# Patient Record
Sex: Male | Born: 1975 | Race: White | Hispanic: No | Marital: Married
Health system: Southern US, Community
[De-identification: ages and names within clinical notes are randomized; demographics above are authoritative.]

## PROBLEM LIST (undated history)

## (undated) DIAGNOSIS — I82409 Acute embolism and thrombosis of unspecified deep veins of unspecified lower extremity: Secondary | ICD-10-CM

## (undated) DIAGNOSIS — E119 Type 2 diabetes mellitus without complications: Secondary | ICD-10-CM

## (undated) DIAGNOSIS — I10 Essential (primary) hypertension: Secondary | ICD-10-CM

## (undated) HISTORY — DX: Essential (primary) hypertension: I10

## (undated) HISTORY — DX: Type 2 diabetes mellitus without complications: E11.9

---

## 2009-07-05 ENCOUNTER — Emergency Department (HOSPITAL_COMMUNITY): Admission: EM | Admit: 2009-07-05 | Discharge: 2009-07-06 | Payer: Self-pay | Admitting: Emergency Medicine

## 2022-02-20 ENCOUNTER — Emergency Department (HOSPITAL_COMMUNITY): Payer: 59

## 2022-02-20 ENCOUNTER — Encounter (HOSPITAL_COMMUNITY): Payer: Self-pay

## 2022-02-20 ENCOUNTER — Emergency Department (HOSPITAL_COMMUNITY)
Admission: EM | Admit: 2022-02-20 | Discharge: 2022-02-20 | Disposition: A | Discharge status: Home / Self Care | Payer: 59 | Attending: Emergency Medicine | Admitting: Emergency Medicine

## 2022-02-20 ENCOUNTER — Other Ambulatory Visit: Payer: Self-pay

## 2022-02-20 DIAGNOSIS — S161XXA Strain of muscle, fascia and tendon at neck level, initial encounter: Secondary | ICD-10-CM | POA: Diagnosis not present

## 2022-02-20 DIAGNOSIS — Y92410 Unspecified street and highway as the place of occurrence of the external cause: Secondary | ICD-10-CM | POA: Diagnosis not present

## 2022-02-20 DIAGNOSIS — S01111A Laceration without foreign body of right eyelid and periocular area, initial encounter: Secondary | ICD-10-CM | POA: Diagnosis not present

## 2022-02-20 DIAGNOSIS — S199XXA Unspecified injury of neck, initial encounter: Secondary | ICD-10-CM | POA: Diagnosis not present

## 2022-02-20 DIAGNOSIS — M25512 Pain in left shoulder: Secondary | ICD-10-CM | POA: Insufficient documentation

## 2022-02-20 DIAGNOSIS — S0083XA Contusion of other part of head, initial encounter: Secondary | ICD-10-CM

## 2022-02-20 MED ORDER — HYDROCODONE-ACETAMINOPHEN 5-325 MG PO TABS: 1.0000 | ORAL_TABLET | Freq: Four times a day (QID) | ORAL | 0 refills | Status: DC | PRN

## 2022-02-20 NOTE — Discharge Instructions (Signed)
Take over-the-counter pain medication as needed.  Expect to be sore and stiff for the next couple days.  Prescription for hydrocodone provided for more severe pain.  Follow-up with your doctor.  Is possible you could have aggravated a pinched nerve in your neck on the left side. ?
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
## 2022-02-20 NOTE — ED Notes (Signed)
Verbalized understanding discharge instructions and follow-up. In no acute distress.  ? ?Electronic prescribing not currently working.  Pt provided w/ 6 tablet Pre Pak.  RN and Pt signed prescription and placed in pharmacy box.  ? ?
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
## 2022-02-25 ENCOUNTER — Encounter (HOSPITAL_COMMUNITY): Payer: Self-pay

## 2022-03-11 DIAGNOSIS — M542 Cervicalgia: Secondary | ICD-10-CM | POA: Diagnosis not present

## 2022-03-11 DIAGNOSIS — Z6838 Body mass index (BMI) 38.0-38.9, adult: Secondary | ICD-10-CM | POA: Diagnosis not present

## 2022-03-11 DIAGNOSIS — S43422A Sprain of left rotator cuff capsule, initial encounter: Secondary | ICD-10-CM | POA: Diagnosis not present

## 2022-04-01 ENCOUNTER — Ambulatory Visit (HOSPITAL_COMMUNITY): Payer: 59 | Attending: Internal Medicine
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
## 2022-04-25 ENCOUNTER — Ambulatory Visit (HOSPITAL_COMMUNITY): Payer: 59 | Attending: Internal Medicine

## 2022-04-25 DIAGNOSIS — M25512 Pain in left shoulder: Secondary | ICD-10-CM | POA: Insufficient documentation

## 2022-04-25 DIAGNOSIS — G8929 Other chronic pain: Secondary | ICD-10-CM | POA: Insufficient documentation

## 2022-04-25 DIAGNOSIS — R278 Other lack of coordination: Secondary | ICD-10-CM | POA: Diagnosis not present

## 2022-04-25 DIAGNOSIS — R29898 Other symptoms and signs involving the musculoskeletal system: Secondary | ICD-10-CM | POA: Diagnosis not present

## 2022-04-25 DIAGNOSIS — M25612 Stiffness of left shoulder, not elsewhere classified: Secondary | ICD-10-CM | POA: Insufficient documentation

## 2022-04-25 DIAGNOSIS — M542 Cervicalgia: Secondary | ICD-10-CM | POA: Insufficient documentation
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
## 2022-04-30 ENCOUNTER — Ambulatory Visit (HOSPITAL_COMMUNITY): Payer: 59

## 2022-04-30 ENCOUNTER — Encounter (HOSPITAL_COMMUNITY): Payer: Self-pay

## 2022-04-30 DIAGNOSIS — G8929 Other chronic pain: Secondary | ICD-10-CM

## 2022-04-30 DIAGNOSIS — R278 Other lack of coordination: Secondary | ICD-10-CM | POA: Diagnosis not present

## 2022-04-30 DIAGNOSIS — M25512 Pain in left shoulder: Secondary | ICD-10-CM | POA: Diagnosis not present

## 2022-04-30 DIAGNOSIS — R29898 Other symptoms and signs involving the musculoskeletal system: Secondary | ICD-10-CM

## 2022-04-30 DIAGNOSIS — M25612 Stiffness of left shoulder, not elsewhere classified: Secondary | ICD-10-CM | POA: Diagnosis not present

## 2022-04-30 DIAGNOSIS — M542 Cervicalgia: Secondary | ICD-10-CM | POA: Diagnosis not present
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
## 2022-05-02 ENCOUNTER — Ambulatory Visit (HOSPITAL_COMMUNITY): Payer: 59

## 2022-05-02 DIAGNOSIS — R29898 Other symptoms and signs involving the musculoskeletal system: Secondary | ICD-10-CM

## 2022-05-02 DIAGNOSIS — R278 Other lack of coordination: Secondary | ICD-10-CM

## 2022-05-02 DIAGNOSIS — G8929 Other chronic pain: Secondary | ICD-10-CM | POA: Diagnosis not present

## 2022-05-02 DIAGNOSIS — M542 Cervicalgia: Secondary | ICD-10-CM | POA: Diagnosis not present

## 2022-05-02 DIAGNOSIS — M25612 Stiffness of left shoulder, not elsewhere classified: Secondary | ICD-10-CM | POA: Diagnosis not present

## 2022-05-02 DIAGNOSIS — M25512 Pain in left shoulder: Secondary | ICD-10-CM | POA: Diagnosis not present
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
## 2022-05-06 ENCOUNTER — Encounter (HOSPITAL_COMMUNITY): Payer: Self-pay

## 2022-05-06 ENCOUNTER — Ambulatory Visit (HOSPITAL_COMMUNITY): Payer: 59

## 2022-05-06 DIAGNOSIS — R278 Other lack of coordination: Secondary | ICD-10-CM

## 2022-05-06 DIAGNOSIS — G8929 Other chronic pain: Secondary | ICD-10-CM | POA: Diagnosis not present

## 2022-05-06 DIAGNOSIS — R29898 Other symptoms and signs involving the musculoskeletal system: Secondary | ICD-10-CM

## 2022-05-06 DIAGNOSIS — M25612 Stiffness of left shoulder, not elsewhere classified: Secondary | ICD-10-CM | POA: Diagnosis not present

## 2022-05-06 DIAGNOSIS — M25512 Pain in left shoulder: Secondary | ICD-10-CM | POA: Diagnosis not present

## 2022-05-06 DIAGNOSIS — M542 Cervicalgia: Secondary | ICD-10-CM | POA: Diagnosis not present
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
## 2022-05-08 ENCOUNTER — Ambulatory Visit (HOSPITAL_COMMUNITY): Payer: 59 | Admitting: Physical Therapy

## 2022-05-08 ENCOUNTER — Ambulatory Visit (HOSPITAL_COMMUNITY): Payer: 59

## 2022-05-14 ENCOUNTER — Ambulatory Visit (HOSPITAL_COMMUNITY): Payer: 59 | Admitting: Physical Therapy

## 2022-05-14 ENCOUNTER — Encounter (HOSPITAL_COMMUNITY): Payer: Self-pay | Admitting: Physical Therapy

## 2022-05-14 ENCOUNTER — Ambulatory Visit (HOSPITAL_COMMUNITY): Payer: 59

## 2022-05-14 ENCOUNTER — Encounter (HOSPITAL_COMMUNITY): Payer: Self-pay

## 2022-05-14 DIAGNOSIS — R29898 Other symptoms and signs involving the musculoskeletal system: Secondary | ICD-10-CM | POA: Diagnosis not present

## 2022-05-14 DIAGNOSIS — M25512 Pain in left shoulder: Secondary | ICD-10-CM | POA: Diagnosis not present

## 2022-05-14 DIAGNOSIS — M25612 Stiffness of left shoulder, not elsewhere classified: Secondary | ICD-10-CM | POA: Diagnosis not present

## 2022-05-14 DIAGNOSIS — G8929 Other chronic pain: Secondary | ICD-10-CM | POA: Diagnosis not present

## 2022-05-14 DIAGNOSIS — M542 Cervicalgia: Secondary | ICD-10-CM

## 2022-05-14 DIAGNOSIS — R278 Other lack of coordination: Secondary | ICD-10-CM | POA: Diagnosis not present
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
## 2022-05-14 NOTE — Patient Instructions (Signed)
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
## 2022-05-16 ENCOUNTER — Ambulatory Visit (HOSPITAL_COMMUNITY): Payer: 59

## 2022-05-16 DIAGNOSIS — R29898 Other symptoms and signs involving the musculoskeletal system: Secondary | ICD-10-CM | POA: Diagnosis not present

## 2022-05-16 DIAGNOSIS — G8929 Other chronic pain: Secondary | ICD-10-CM

## 2022-05-16 DIAGNOSIS — M25512 Pain in left shoulder: Secondary | ICD-10-CM | POA: Diagnosis not present

## 2022-05-16 DIAGNOSIS — M542 Cervicalgia: Secondary | ICD-10-CM | POA: Diagnosis not present

## 2022-05-16 DIAGNOSIS — M25612 Stiffness of left shoulder, not elsewhere classified: Secondary | ICD-10-CM

## 2022-05-16 DIAGNOSIS — R278 Other lack of coordination: Secondary | ICD-10-CM
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
## 2022-05-20 ENCOUNTER — Encounter (HOSPITAL_COMMUNITY): Payer: Self-pay | Admitting: Physical Therapy

## 2022-05-20 ENCOUNTER — Ambulatory Visit (HOSPITAL_COMMUNITY): Payer: 59 | Admitting: Physical Therapy

## 2022-05-20 DIAGNOSIS — M25512 Pain in left shoulder: Secondary | ICD-10-CM | POA: Diagnosis not present

## 2022-05-20 DIAGNOSIS — M542 Cervicalgia: Secondary | ICD-10-CM

## 2022-05-20 DIAGNOSIS — M25612 Stiffness of left shoulder, not elsewhere classified: Secondary | ICD-10-CM | POA: Diagnosis not present

## 2022-05-20 DIAGNOSIS — R29898 Other symptoms and signs involving the musculoskeletal system: Secondary | ICD-10-CM | POA: Diagnosis not present

## 2022-05-20 DIAGNOSIS — G8929 Other chronic pain: Secondary | ICD-10-CM | POA: Diagnosis not present

## 2022-05-20 DIAGNOSIS — R278 Other lack of coordination: Secondary | ICD-10-CM | POA: Diagnosis not present
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
## 2022-05-21 ENCOUNTER — Ambulatory Visit (HOSPITAL_COMMUNITY): Payer: 59

## 2022-05-21 ENCOUNTER — Encounter (HOSPITAL_COMMUNITY): Payer: Self-pay

## 2022-05-21 ENCOUNTER — Ambulatory Visit (HOSPITAL_COMMUNITY): Payer: 59 | Attending: Internal Medicine

## 2022-05-21 DIAGNOSIS — G8929 Other chronic pain: Secondary | ICD-10-CM | POA: Insufficient documentation

## 2022-05-21 DIAGNOSIS — R278 Other lack of coordination: Secondary | ICD-10-CM | POA: Diagnosis not present

## 2022-05-21 DIAGNOSIS — M25612 Stiffness of left shoulder, not elsewhere classified: Secondary | ICD-10-CM | POA: Insufficient documentation

## 2022-05-21 DIAGNOSIS — M542 Cervicalgia: Secondary | ICD-10-CM | POA: Diagnosis not present

## 2022-05-21 DIAGNOSIS — R29898 Other symptoms and signs involving the musculoskeletal system: Secondary | ICD-10-CM

## 2022-05-21 DIAGNOSIS — M25512 Pain in left shoulder: Secondary | ICD-10-CM | POA: Diagnosis not present
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
## 2022-05-22 ENCOUNTER — Encounter (HOSPITAL_COMMUNITY): Payer: 59 | Admitting: Physical Therapy

## 2022-05-23 ENCOUNTER — Encounter (HOSPITAL_COMMUNITY): Payer: 59 | Admitting: Physical Therapy

## 2022-05-23 ENCOUNTER — Encounter (HOSPITAL_COMMUNITY): Payer: 59

## 2022-05-24 ENCOUNTER — Encounter (HOSPITAL_COMMUNITY): Payer: Self-pay | Admitting: Occupational Therapy

## 2022-05-24 ENCOUNTER — Ambulatory Visit (HOSPITAL_COMMUNITY): Payer: 59 | Admitting: Occupational Therapy

## 2022-05-24 DIAGNOSIS — R29898 Other symptoms and signs involving the musculoskeletal system: Secondary | ICD-10-CM | POA: Diagnosis not present

## 2022-05-24 DIAGNOSIS — M25612 Stiffness of left shoulder, not elsewhere classified: Secondary | ICD-10-CM

## 2022-05-24 DIAGNOSIS — G8929 Other chronic pain: Secondary | ICD-10-CM | POA: Diagnosis not present

## 2022-05-24 DIAGNOSIS — R278 Other lack of coordination: Secondary | ICD-10-CM | POA: Diagnosis not present

## 2022-05-24 DIAGNOSIS — M25512 Pain in left shoulder: Secondary | ICD-10-CM | POA: Diagnosis not present

## 2022-05-24 DIAGNOSIS — M542 Cervicalgia: Secondary | ICD-10-CM | POA: Diagnosis not present

## 2022-05-24 NOTE — Patient Instructions (Signed)
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
## 2022-05-28 ENCOUNTER — Encounter (HOSPITAL_COMMUNITY): Payer: 59 | Admitting: Physical Therapy

## 2022-05-28 ENCOUNTER — Encounter (HOSPITAL_COMMUNITY): Payer: 59

## 2022-05-30 ENCOUNTER — Encounter (HOSPITAL_COMMUNITY): Payer: 59

## 2022-05-30 ENCOUNTER — Telehealth (HOSPITAL_COMMUNITY): Payer: Self-pay

## 2022-05-30 NOTE — Telephone Encounter (Signed)
No show, called and left message concerning missed apt today.  Requested pt to call back as he has no further apts or let us know if he wishes to be DC'd from PT.  Included contact number for call back.    Becky Sax, LPTA/CLT; [facility] [phone]

## 2022-06-04 ENCOUNTER — Encounter (HOSPITAL_COMMUNITY): Payer: 59

## 2022-06-06 ENCOUNTER — Encounter (HOSPITAL_COMMUNITY): Payer: 59

## 2022-06-11 ENCOUNTER — Encounter (HOSPITAL_COMMUNITY): Payer: 59

## 2022-06-13 ENCOUNTER — Encounter (HOSPITAL_COMMUNITY): Payer: 59

## 2022-10-08 ENCOUNTER — Encounter (HOSPITAL_COMMUNITY): Payer: Self-pay

## 2022-10-08 NOTE — Therapy (Signed)
PHYSICAL THERAPY DISCHARGE SUMMARY  Visits from Start of Care: 3  Current functional level related to goals / functional outcomes: na   Remaining deficits: na   Education / Equipment: na   Patient agrees to discharge. Patient goals were not met. Patient is being discharged due to not returning since the last visit.
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
## 2024-09-01 NOTE — ED Triage Notes (Signed)
 Pt coming from home c/o left leg swelling and pain x 2 days, worse with standing. Pt states that he was moving a stove up stairs and states that legs buckled underneath him. Pt states that he took 800 mg of ibuprofen at 1630.

## 2024-09-01 NOTE — ED Provider Notes (Signed)
 [facility]  Emergency Department Provider Note     History   Chief Complaint Leg Swelling   HPI  Benjamin [name] is a 48 y.o. male presents with pain in the left leg.  He says he has some swelling noted in his left leg or left upper thigh or behind the knee.  [name] recent injury [name] recent fall patient works as a financial risk analyst.  Patient denies any twisting motion denies any repetitive activity denied any other injury.  Couple days ago he was pushing up some piece of furniture or appliance without help with somebody else at that time.  Patient feels that the upper calf might be swollen compared on his evaluation observation.  However when he puts both legs together they seem to be symmetrical.  Patient's measurements of the calfs were also symmetrical by nurse here at the ED.  There is [name] evidence of any redness noted of his legs.    Past Medical History: [name] date: DVT (deep venous thrombosis) (CMS-HCC)  [name] past surgical history on file.  Prior to Admission medications  Medication Dose, Route, Frequency  cyclobenzaprine (FLEXERIL) 10 MG tablet 10 mg, Oral, 2 times a day PRN, Avoid driving or operating dangerous machinery while taking this medication as it will cause drowsiness Patient not taking: Reported on 09/01/2024  HYDROcodone -acetaminophen  (NORCO) 5-325 mg per tablet 1 tablet Patient not taking: Reported on 09/01/2024    Allergies Shellfish containing products   Short Social History[1]  Review of Systems  Musculoskeletal:        Left leg pain left calf pain  All other systems reviewed and are negative.   As in HPI, all systems reviewed and otherwise negative.  Physical Exam    Vitals:   09/01/24 1924  BP: 167/97  Pulse: 99  Resp: 18  Temp: 36.5 C (97.7 F)  TempSrc: Temporal  SpO2: 97%  Weight: (!) 121.2 kg (267 lb 3.2 oz)  Height: 180.3 cm (5' 11)     Physical Exam  Constitutional: Patient appears well-developed and well nourished. Non  toxic in appearance. HEENT: Unremarkable. Head: Atraumatic.  Eyes: Normal ocular movements. Neck: Supple with normal range of motion.  Pulmonary/Chest: Effort normal. [name] respiratory distress. Abdominal: Soft and non tender abdomen. Musculoskeletal: Extremities atraumatic.  Achilles tendon is intact and normal with full range of motion at the ankle.  There is full range of motion at the left knee as well as left ankle as well as left hip.  There is full passive and active range of motion at the left hip left knee as well as left ankle.  Pulses are intact and normal bilateral femoral popliteal dorsalis pedis and posterior tibial on the left side.  Skin color is normal on the left lower extremity and symmetrical with the contralateral right side.  There is [name] evidence of ecchymosis hematoma erythema edema induration of either lower extremities.  Negative Homans' sign.  There is [name] palpable mass on the popliteal area or the calf or the rest of the left lower extremity.  Normal motor strength in the left lower extremity at all major muscle groups.  Sensations intact in the left lower extremity.  Cap refill less than 2 seconds at all toes in the left lower extremity toes. Neurological: Alert with [name] focal neurological deficit. Ambulatory with a steady gait. Skin: Warm and dry.  Nursing note and vital signs reviewed.   ED Course        Procedures  Medications ordered during this encounter  Medications  . enoxaparin (LOVENOX) syringe 120 mg    ED Results Results for orders placed or performed during the hospital encounter of 09/01/24  D-Dimer, Quantitative  Result Value Ref Range   D-Dimer 804 (H) <=500 ng/mL FEU    Radiology [name] results found.   Medical Decision Making   I have reviewed the vital signs and the nursing notes. Labs and radiology results that were available during my care of the patient were independently reviewed by me and considered in my medical decision making.     This is a comfortable appearing 48 year old male with symmetrical measurements of the bilateral calfs with previous history of DVT with [name] history of any shortness of breath or chest pain.  Patient has [name] evidence of any cords on palpation or anything to suggest strain on physical examination that he has DVT however the D-dimer is elevated I do not have another D-dimer to compare against.  I have advised the patient to follow-up with his primary care physician for Singh in the morning at 8 AM to have an ultrasound ordered to rule out for DVT.  Patient's primary care physician is Dr. Orpha.  If he runs into any trouble with seeing his or contacting his primary care physician for an ultrasound he can return back to the emergency room if needed.  Medical Decision Making Amount and/or Complexity of Data Reviewed Labs: ordered.  Risk Prescription drug management.     Differential Diagnosis: Popliteal cyst, calf strain, calf sprain, musculoskeletal pain, joint pains     ED Clinical Impression   Final diagnoses:  Left leg pain (Primary)     Procedures      This record has been created using [employer]. Chart creation errors have been sought, but may not always have been located. Such creation errors do not reflect on the standard of medical care.       [1] Social History Tobacco Use  . Smoking status: Former    Types: Cigarettes  . Smokeless tobacco: Never  Substance Use Topics  . Alcohol use: Yes    Comment: Occassionally  . Drug use: Yes    Types: Marijuana   Benjamin Jonelle Lash, MD 09/01/24 2100

## 2024-09-03 ENCOUNTER — Emergency Department (HOSPITAL_COMMUNITY)
Admission: EM | Admit: 2024-09-03 | Discharge: 2024-09-03 | Disposition: A | Discharge status: Home / Self Care | Payer: Self-pay | Attending: Emergency Medicine | Admitting: Emergency Medicine

## 2024-09-03 ENCOUNTER — Encounter (HOSPITAL_COMMUNITY): Payer: Self-pay | Admitting: Emergency Medicine

## 2024-09-03 ENCOUNTER — Other Ambulatory Visit: Payer: Self-pay

## 2024-09-03 DIAGNOSIS — M79605 Pain in left leg: Secondary | ICD-10-CM | POA: Insufficient documentation

## 2024-09-03 HISTORY — DX: Acute embolism and thrombosis of unspecified deep veins of unspecified lower extremity: I82.409

## 2024-09-03 MED ORDER — ENOXAPARIN SODIUM 40 MG/0.4ML IJ SOSY: 1.0000 mg/kg | PREFILLED_SYRINGE | Freq: Once | INTRAMUSCULAR | 0 refills | Status: DC

## 2024-09-03 MED ORDER — ENOXAPARIN SODIUM 120 MG/0.8ML IJ SOSY
1.0000 mg/kg | PREFILLED_SYRINGE | Freq: Once | INTRAMUSCULAR | Status: AC
  Administered 2024-09-03: 120 mg via SUBCUTANEOUS
  Filled 2024-09-03: qty 0.8

## 2024-09-03 NOTE — ED Triage Notes (Signed)
 Pt arrived to ED c/o continued left knee/leg pain. Pt states he has a knot behind his left knee. Pt sent at [facility] 2 days ago and they were concerned for DVT, they were unable to do US  there. Pt was given SQ lovenox 120 mg and discharged home. Pt attempted to follow up with PCP to have US  completed but was unable to due to insurance problems.   Per pt his D-Dimer was elevated. Pt was given knee immobilizer and crutches with discharge.

## 2024-09-03 NOTE — ED Notes (Signed)
 Ace wrap was applied to left lower leg for comfort

## 2024-09-03 NOTE — ED Provider Notes (Signed)
 [facility] EMERGENCY DEPARTMENT AT [facility] Provider Note  CSN: [id number] Arrival date & time: 09/03/24 1904  Chief Complaint(s) Leg Pain  HPI Benjamin [name] is a 48 y.o. male with history of DVT presenting to the emergency department for left leg pain.  He reports pain for few days, went to [facility], was after ultrasound hours so D-dimer was checked which was elevated, patient was instructed to follow-up with primary physician for ultrasound.  He was not able to get an ultrasound.  He reports ongoing pain.  Denies any swelling, recent travel or surgeries, chest pain, shortness of breath, injury.   Past Medical History Past Medical History:  Diagnosis Date   DVT (deep venous thrombosis) (HCC)    2021   There are no active problems to display for this patient.  Home Medication(s) Prior to Admission medications   Medication Sig Start Date End Date Taking? Authorizing Provider  enoxaparin (LOVENOX) 40 MG/0.4ML injection Inject 1.2 mLs (120 mg total) into the skin once for 1 dose. 09/03/24 09/03/24 Yes Francesca Elsie CROME, MD  HYDROcodone -acetaminophen  (NORCO/VICODIN) 5-325 MG tablet Take 1 tablet by mouth every 6 (six) hours as needed. 02/20/22   Zackowski, Scott, MD                                                                                                                                    Past Surgical History History reviewed. No pertinent surgical history. Family History History reviewed. No pertinent family history.  Social History Social History   Tobacco Use   Smoking status: Unknown   Allergies Patient has no known allergies.  Review of Systems Review of Systems  All other systems reviewed and are negative.   Physical Exam Vital Signs  I have reviewed the triage vital signs BP (!) 139/94   Pulse 98   Temp (!) 97.4 F (36.3 C) (Temporal)   Resp 18   Ht 5' 10 (1.778 m)   Wt 121.1 kg   SpO2 95%   BMI 38.31 kg/m  Physical  Exam Vitals and nursing note reviewed.  Constitutional:      General: He is not in acute distress.    Appearance: Normal appearance.  HENT:     Mouth/Throat:     Mouth: Mucous membranes are moist.  Eyes:     Conjunctiva/sclera: Conjunctivae normal.  Cardiovascular:     Rate and Rhythm: Normal rate and regular rhythm.  Pulmonary:     Effort: Pulmonary effort is normal. No respiratory distress.     Breath sounds: Normal breath sounds.  Abdominal:     General: Abdomen is flat.     Palpations: Abdomen is soft.     Tenderness: There is no abdominal tenderness.  Musculoskeletal:     Right lower leg: No edema.     Left lower leg: No edema.     Comments: Some left calf tenderness without edema or erythema.  Range of motion of the hip, knee, ankle intact.  No wound  Skin:    General: Skin is warm and dry.     Capillary Refill: Capillary refill takes less than 2 seconds.  Neurological:     Mental Status: He is alert and oriented to person, place, and time. Mental status is at baseline.  Psychiatric:        Mood and Affect: Mood normal.        Behavior: Behavior normal.     ED Results and Treatments Labs (all labs ordered are listed, but only abnormal results are displayed) Labs Reviewed - No data to display                                                                                                                        Radiology No results found.  Pertinent labs & imaging results that were available during my care of the patient were reviewed by me and considered in my medical decision making (see MDM for details).  Medications Ordered in ED Medications - No data to display                                                                                                                                   Procedures Procedures  (including critical care time)  Medical Decision Making / ED Course   MDM:  48 year old presenting to the emergency department with leg  pain.  On exam, patient has no obvious external signs of DVT but does have calf tenderness.  He reports he has a history of DVT.  Reviewed outside labs which do show elevated D-dimer.  Presentation is concerning for possible DVT.  He will need ultrasound.  Ultrasound not available.  Instructed patient to return tomorrow a.m. for ultrasound and will give dose of Lovenox.  Low concern for other process such as infection, traumatic injury.      Additional history obtained:  -External records from outside source obtained and reviewed including: Chart review including previous notes, labs, imaging, consultation notes including outside records    Medicines ordered and prescription drug management: Meds ordered this encounter  Medications   enoxaparin (LOVENOX) 40 MG/0.4ML injection    Sig: Inject 1.2 mLs (120 mg total) into the skin once for 1 dose.    Dispense:  1.2 mL    Refill:  0    -I have reviewed the patients home medicines  and have made adjustments as needed  Social Determinants of Health:  Diagnosis or treatment significantly limited by social determinants of health: obesity   Co morbidities that complicate the patient evaluation  Past Medical History:  Diagnosis Date   DVT (deep venous thrombosis) (HCC)    2021      Dispostion: Disposition decision including need for hospitalization was considered, and patient discharged from emergency department.    Final Clinical Impression(s) / ED Diagnoses Final diagnoses:  Left leg pain     This chart was dictated using voice recognition software.  Despite best efforts to proofread,  errors can occur which can change the documentation meaning.    Francesca Elsie CROME, MD 09/03/24 2118

## 2024-09-03 NOTE — Discharge Instructions (Addendum)
 We evaluated you for your leg pain.  You may have a DVT.  We do not have ultrasound at this time of day.  We have ordered you an ultrasound for tomorrow.  Please come back tomorrow to have the ultrasound performed.  We have given you a dose of a blood thinner to last you until tomorrow.  Please return if you have any new or worsening symptoms such as chest pain, shortness of breath, swelling of your leg, fevers or chills, or any other concerning symptoms.

## 2024-09-03 NOTE — ED Notes (Signed)
 Pt/family received d/c paperwork at this time. After going over the paperwork any questions, comments, or concerns were answered to the best of this nurse's knowledge. The pt/family verbally acknowledged the teachings/instructions.

## 2024-09-04 ENCOUNTER — Ambulatory Visit (HOSPITAL_COMMUNITY)
Admission: RE | Admit: 2024-09-04 | Discharge: 2024-09-04 | Disposition: A | Discharge status: Home / Self Care | Payer: Self-pay | Source: Ambulatory Visit | Attending: Emergency Medicine | Admitting: Emergency Medicine

## 2024-09-04 ENCOUNTER — Telehealth (HOSPITAL_COMMUNITY): Payer: Self-pay | Admitting: Physician Assistant

## 2024-09-04 DIAGNOSIS — M25562 Pain in left knee: Secondary | ICD-10-CM | POA: Diagnosis not present

## 2024-09-04 DIAGNOSIS — I82432 Acute embolism and thrombosis of left popliteal vein: Secondary | ICD-10-CM | POA: Insufficient documentation

## 2024-09-04 DIAGNOSIS — R600 Localized edema: Secondary | ICD-10-CM | POA: Insufficient documentation

## 2024-09-04 DIAGNOSIS — M79662 Pain in left lower leg: Secondary | ICD-10-CM | POA: Diagnosis not present

## 2024-09-04 DIAGNOSIS — Z86718 Personal history of other venous thrombosis and embolism: Secondary | ICD-10-CM | POA: Diagnosis not present

## 2024-09-04 MED ORDER — APIXABAN (ELIQUIS) VTE STARTER PACK (10MG AND 5MG): ORAL_TABLET | ORAL | 0 refills | Status: DC

## 2024-09-04 MED ORDER — OXYCODONE HCL 5 MG PO TABS: 5.0000 mg | ORAL_TABLET | Freq: Four times a day (QID) | ORAL | 0 refills | Status: DC | PRN

## 2024-09-04 NOTE — Telephone Encounter (Cosign Needed)
 Is return to ED for outpatient ultrasound which is positive for left DVT.  Leg has no significant swelling, no color change, normal sensation and he is able to ambulate, do not feel he needs thrombectomy, will prescribe Eliquis.  Patient is unsure if he has insurance, discussed with pharmacist Dempsey who is going to bring patient 30-day free card.  Will refer patient to DVT clinic.  This is his second DVT, though he was instructed that he will likely be on this lifetime.  He is not having any chest pain or shortness of breath but was instructed if he develops any symptoms or other worrisome symptoms to come back to the ER right away due to concern for PE.  Patient will need outpatient follow-up for labs and continuing medications.  He also does not have any pain medication at home, so I will prescribe short supply of Norco. Bleeding risks discussed with patient as well

## 2024-09-10 NOTE — Progress Notes (Unsigned)
 DVT Clinic Note  Name: Benjamin [name]     MRN: [id number]     DOB: Jan 21, 1976     Sex: male  PCP: Orpha Yancey LABOR, MD  Today's Visit: Visit Information: Initial Visit  Referred to DVT Clinic by: Emergency Department - Sherran Barks, PA Referred to CPP by: Dr. Gretta Reason for referral:  Chief Complaint  Patient presents with   DVT   HISTORY OF PRESENT ILLNESS: Benjamin [name] is a 48 y.o. male with PMH of DVT who presents for follow up medication management after diagnosis of DVT on 09/04/24. Patient presented to [facility] ED on 09/01/24 complaining of pain in his left leg. He was found to have an elevated D-Dimer but the ultrasound department was closed for the night. He was instructed to follow-up with his PCP on 09/02/24 to obtain an ultrasound but he was unable to. Patient presented on 09/03/24 to [facility] ED, but ultrasound was unavailable. He was given a dose of Lovenox  and was instructed to return 09/04/24 for an ultrasound. Ultrasound showed acute DVT in his left popliteal vein and calf veins and superficial thrombophlebitis of his left greater saphenous vein. He was started on Eliquis  at time of ED discharge.  Today, patient presents with his wife. Patient reports he was moving a new stove into his house at the beginning of the month and began having pain in his left leg about 1 week later. He reports he initially thought he pulled a muscle but the pain and swelling were persistent so he went to the ED. Reports pain behind his left knee has not improved since starting Eliquis , but swelling has improved. Denies abnormal bleeding or bruising. Denies missed doses of Eliquis . Endorses wearing compression stockings and elevating his legs. Patient has a history of prior DVT in his left leg in 2021 while he was living in [place]. Patient reports he was homeless at the time and was living in his car so his clot was attributed to sitting too much. He reports he was treated for 3  months with a blood thinner. No family history of DVT. He denies recent travel, hospitalization, trauma, surgery, or illness within the past couple weeks. He works on his feet 8 hours per day, lifting boxes and preparing sandwiches.   Positive Thrombotic Risk Factors: Previous VTE, Obesity Bleeding Risk Factors: None Present  Negative Thrombotic Risk Factors: Recent surgery (within 3 months), Recent trauma (within 3 months), Recent admission to hospital with acute illness (within 3 months), Paralysis, paresis, or recent plaster cast immobilization of lower extremity, Central venous catheterization, Sedentary journey lasting >8 hours within 4 weeks, Bed rest >72 hours within 3 months, Within 6 weeks postpartum, Recent cesarean section (within 3 months), Estrogen therapy, Pregnancy, Recent COVID diagnosis (within 3 months), Erythropoiesis-stimulating agent, Testosterone therapy, Active cancer, Non-malignant, chronic inflammatory condition, Known thrombophilic condition, Smoking, Older age  Rx Insurance Coverage: Uninsured. Patient reports he applied to [facility] 09/10/24 and is waiting to hear back.  Rx Affordability: Eliquis  is unaffordable as he is currently uninsured  Rx Assistance Provided: Eliquis  starter dose pack was filled with free trial card. Medicaid application pending. If denied, could apply for manufacturer Patient Assistance Program.  Preferred Pharmacy: [facility]    Past Medical History:  Diagnosis Date   DVT (deep venous thrombosis) (HCC)    2021    No past surgical history on file.  Social History   Socioeconomic History   Marital status: Married    Spouse name: Not  on file   Number of children: Not on file   Years of education: Not on file   Highest education level: Not on file  Occupational History   Not on file  Tobacco Use   Smoking status: Unknown   Smokeless tobacco: Not on file  Substance and Sexual Activity   Alcohol use: Not on file   Drug use: Not on  file   Sexual activity: Not on file  Other Topics Concern   Not on file  Social History Narrative   ** Merged History Encounter **       Social Drivers of Health   Financial Resource Strain: Not on file  Food Insecurity: Not on file  Transportation Needs: Not on file  Physical Activity: Not on file  Stress: Not on file  Social Connections: Unknown (12/18/2022)   Received from [facility]   Social Network    Social Network: Not on file  Intimate Partner Violence: Unknown (12/18/2022)   Received from [facility]   HITS    Physically Hurt: Not on file    Insult or Talk Down To: Not on file    Threaten Physical Harm: Not on file    Scream or Curse: Not on file    No family history on file.  Allergies as of 09/14/2024   (No Known Allergies)    Current Outpatient Medications on File Prior to Visit  Medication Sig Dispense Refill   APIXABAN  (ELIQUIS ) VTE STARTER PACK (10MG  AND 5MG ) Take as directed on package: start with two-5mg  tablets twice daily for 7 days. On day 8, switch to one-5mg  tablet twice daily. 74 each 0   oxyCODONE  (ROXICODONE ) 5 MG immediate release tablet Take 1 tablet (5 mg total) by mouth every 6 (six) hours as needed. (Patient not taking: Reported on 09/14/2024) 6 tablet 0   No current facility-administered medications on file prior to visit.   REVIEW OF SYSTEMS:  Review of Systems  Respiratory:  Negative for sputum production.   Cardiovascular:  Negative for chest pain.  All other systems reviewed and are negative.  PHYSICAL EXAMINATION:  Vitals:   09/14/24 1508  BP: (!) 147/99  Pulse: 98  SpO2: 96%    There is no height or weight on file to calculate BMI.  Physical Exam Vitals reviewed.  Pulmonary:     Effort: Pulmonary effort is normal. No respiratory distress.  Neurological:     Mental Status: He is alert.  Psychiatric:        Mood and Affect: Mood normal.        Behavior: Behavior normal.        Thought Content: Thought content normal.     Villalta Score for Post-Thrombotic Syndrome: Pain: Moderate Cramps: Absent Heaviness: Absent Paresthesia: Absent Pruritus: Absent Pretibial Edema: Absent Skin Induration: Absent Hyperpigmentation: Absent Redness: Absent Venous Ectasia: Absent Pain on calf compression: Mild Villalta Preliminary Score: 3 Is venous ulcer present?: No If venous ulcer is present and score is <15, then 15 points total are assigned: Absent Villalta Total Score: 3  LABS:  CBC  No results found for: WBC, RBC, HGB, HCT, PLT, MCV, MCH, MCHC, RDW, LYMPHSABS, MONOABS, EOSABS, BASOSABS  Hepatic Function   No results found for: PROT, ALBUMIN, AST, ALT, ALKPHOS, BILITOT, BILIDIR, IBILI  Renal Function   No results found for: CREATININE  CrCl cannot be calculated (No successful lab value found.).   VVS Vascular Lab Studies:  US  VENOUS IMG LOWER UNILATERAL LEFT (DVT)  FINDINGS: Contralateral Common Femoral  Vein: Respiratory phasicity is normal and symmetric with the symptomatic side. No evidence of thrombus.Normal compressibility.   Common Femoral Vein: No evidence of thrombus. Normal compressibility, respiratory phasicity and response to augmentation.   Saphenofemoral Junction: No evidence of thrombus. Normal compressibility and flow on color Doppler imaging.   Profunda Femoral Vein: No evidence of thrombus. Normal compressibility and flow on color Doppler imaging.   Femoral Vein: No evidence of thrombus. Normal compressibility, respiratory phasicity and response to augmentation.   Popliteal Vein: Occlusive thrombus present.   Calf Veins: Thrombus identified in visualized segments of peroneal and posterior tibial veins.   Superficial Great Saphenous Vein: Superficial thrombophlebitis the left great saphenous vein in the calf and thigh.   Venous Reflux:  None.   Other Findings:  No abnormal fluid collections.   IMPRESSION: 1. Positive for deep vein  thrombosis in the left popliteal vein and calf veins. 2. Superficial thrombophlebitis of the left great saphenous vein in the calf and thigh.  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 Patient reports previous DVT was attributed to spending long amounts of time in the car while he was homeless. On exam today, patient has pain primarily behind his left knee, but no swelling. Patient reports tolerating Eliquis  well. No provoking risk factors for DVT identified. Due to the lack of provoking risk factors identified and the prior LLE DVT, will refer him to hematology for further work up and anticoagulation management. Patient is currently uninsured and has a Medicaid application pending. Instructed the patient to check the status of his application if he has not heard an update within one week. Patient will call us  with updates. No recent labs, so will check a CBC and CMP today. Will follow up with patient to ensure no barriers to medication access. If Medicaid is denied, could submit an Eliquis  patient assistance application to ensure ongoing access to anticoagulation. Patient and his wife confirmed understanding of plan and all of their questions were answered.   PLAN: -Continue apixaban  (Eliquis ) 5 mg twice daily. -Expected duration of therapy: per hematology. Therapy started on 09/04/24. -Patient educated on purpose, proper use and potential adverse effects of apixaban  (Eliquis ). -Discussed importance of taking medication around the same time every day. -Advised patient of medications to avoid (NSAIDs, aspirin doses >100 mg daily). -Educated that Tylenol  (acetaminophen ) is the preferred analgesic to lower the risk of bleeding. -Advised patient to alert all providers of anticoagulation  therapy prior to starting a new medication or having a procedure. -Emphasized importance of monitoring for signs and symptoms of bleeding (abnormal bruising, prolonged bleeding, nose bleeds, bleeding from gums, discolored urine, black tarry stools). -Educated patient to present to the ED if emergent signs and symptoms of new thrombosis occur. -Counseled patient to wear compression stockings daily, removing at night.  Follow up: Referred to hematology for further management   Duwaine Dry Student Pharmacist  09/14/2024,4:22 PM]  Izetta Henry, PharmD [facility] [phone]

## 2024-09-14 ENCOUNTER — Other Ambulatory Visit (HOSPITAL_COMMUNITY): Payer: Self-pay

## 2024-09-14 ENCOUNTER — Ambulatory Visit: Attending: Vascular Surgery | Admitting: Pharmacist

## 2024-09-14 VITALS — BP 147/99 | HR 98

## 2024-09-14 DIAGNOSIS — I82432 Acute embolism and thrombosis of left popliteal vein: Secondary | ICD-10-CM

## 2024-09-14 NOTE — Patient Instructions (Addendum)
-  Continue taking Eliquis  5 mg (1 tablet) twice daily. - If you have not heard from Medicaid in 1 week, please check on the status of the application and call me with an update.  -It is important to take your medication around the same time every day.  -Avoid NSAIDs like ibuprofen (Advil, Motrin) and naproxen (Aleve) as well as aspirin doses over 100 mg daily. -Tylenol  (acetaminophen ) is the preferred over the counter pain medication to lower the risk of bleeding. -Be sure to alert all of your health care providers that you are taking an anticoagulant prior to starting a new medication or having a procedure. -Monitor for signs and symptoms of bleeding (abnormal bruising, prolonged bleeding, nose bleeds, bleeding from gums, discolored urine, black tarry stools). If you have fallen and hit your head OR if your bleeding is severe or not stopping, seek emergency care.  -Go to the emergency room if emergent signs and symptoms of new clot occur (new or worse swelling and pain in an arm or leg, shortness of breath, chest pain, fast or irregular heartbeats, lightheadedness, dizziness, fainting, coughing up blood) or if you experience a significant color change (pale or blue) in the extremity that has the DVT.  -We recommend you wear compression stockings (20-30 mmHg) as long as you are having swelling or pain. Be sure to purchase the correct size and take them off at night.   If you have any questions or need to reschedule an appointment, please call [phone]. If you are having an emergency, call 911 or present to the nearest emergency room.   What is a DVT?  -Deep vein thrombosis (DVT) is a condition in which a blood clot forms in a vein of the deep venous system which can occur in the lower leg, thigh, pelvis, arm, or neck. This condition is serious and can be life-threatening if the clot travels to the arteries of the lungs and causing a blockage (pulmonary embolism, PE). A DVT can also damage veins in  the leg, which can lead to long-term venous disease, leg pain, swelling, discoloration, and ulcers or sores (post-thrombotic syndrome).  -Treatment may include taking an anticoagulant medication to prevent more clots from forming and the current clot from growing, wearing compression stockings, and/or surgical procedures to remove or dissolve the clot.

## 2024-09-15 LAB — COMPREHENSIVE METABOLIC PANEL WITH GFR
ALT: 23 IU/L (ref 0–44)
AST: 18 IU/L (ref 0–40)
Albumin: 4.2 g/dL (ref 4.1–5.1)
Alkaline Phosphatase: 99 IU/L (ref 47–123)
BUN/Creatinine Ratio: 12 (ref 9–20)
BUN: 11 mg/dL (ref 6–24)
Bilirubin Total: 0.4 mg/dL (ref 0.0–1.2)
CO2: 24 mmol/L (ref 20–29)
Calcium: 9.4 mg/dL (ref 8.7–10.2)
Chloride: 95 mmol/L — ABNORMAL LOW (ref 96–106)
Creatinine, Ser: 0.94 mg/dL (ref 0.76–1.27)
Globulin, Total: 3.1 g/dL (ref 1.5–4.5)
Glucose: 407 mg/dL — ABNORMAL HIGH (ref 70–99)
Potassium: 4.4 mmol/L (ref 3.5–5.2)
Sodium: 136 mmol/L (ref 134–144)
Total Protein: 7.3 g/dL (ref 6.0–8.5)
eGFR: 101 mL/min/1.73 (ref >59)

## 2024-09-15 LAB — CBC
Hematocrit: 43.5 % (ref 37.5–51.0)
Hemoglobin: 14.3 g/dL (ref 13.0–17.7)
MCH: 30.6 pg (ref 26.6–33.0)
MCHC: 32.9 g/dL (ref 31.5–35.7)
MCV: 93 fL (ref 79–97)
Platelets: 222 x10E3/uL (ref 150–450)
RBC: 4.67 x10E6/uL (ref 4.14–5.80)
RDW: 11.9 % (ref 11.6–15.4)
WBC: 7.6 x10E3/uL (ref 3.4–10.8)

## 2024-09-20 ENCOUNTER — Other Ambulatory Visit (HOSPITAL_COMMUNITY): Payer: Self-pay

## 2024-09-20 ENCOUNTER — Ambulatory Visit: Payer: Self-pay | Admitting: Pharmacist

## 2024-09-20 DIAGNOSIS — I82432 Acute embolism and thrombosis of left popliteal vein: Secondary | ICD-10-CM

## 2024-09-20 MED ORDER — APIXABAN 5 MG PO TABS: 5.0000 mg | ORAL_TABLET | Freq: Two times a day (BID) | ORAL | 0 refills | Status: AC

## 2024-09-24 ENCOUNTER — Other Ambulatory Visit (HOSPITAL_COMMUNITY): Payer: Self-pay

## 2024-11-12 ENCOUNTER — Inpatient Hospital Stay: Payer: Self-pay | Attending: Hematology | Admitting: Hematology

## 2024-11-12 ENCOUNTER — Encounter: Payer: Self-pay | Admitting: Hematology

## 2024-11-12 ENCOUNTER — Inpatient Hospital Stay: Payer: Self-pay

## 2024-11-12 VITALS — BP 142/91 | HR 106 | Temp 97.8°F | Resp 18 | Ht 70.0 in | Wt 248.0 lb

## 2024-11-12 DIAGNOSIS — I10 Essential (primary) hypertension: Secondary | ICD-10-CM | POA: Insufficient documentation

## 2024-11-12 DIAGNOSIS — Z79899 Other long term (current) drug therapy: Secondary | ICD-10-CM | POA: Insufficient documentation

## 2024-11-12 DIAGNOSIS — Z7901 Long term (current) use of anticoagulants: Secondary | ICD-10-CM | POA: Diagnosis not present

## 2024-11-12 DIAGNOSIS — I82402 Acute embolism and thrombosis of unspecified deep veins of left lower extremity: Secondary | ICD-10-CM | POA: Insufficient documentation

## 2024-11-12 DIAGNOSIS — Z87891 Personal history of nicotine dependence: Secondary | ICD-10-CM | POA: Insufficient documentation

## 2024-11-12 DIAGNOSIS — Z803 Family history of malignant neoplasm of breast: Secondary | ICD-10-CM | POA: Insufficient documentation

## 2024-11-12 DIAGNOSIS — E663 Overweight: Secondary | ICD-10-CM | POA: Diagnosis not present

## 2024-11-12 DIAGNOSIS — I82432 Acute embolism and thrombosis of left popliteal vein: Secondary | ICD-10-CM

## 2024-11-12 NOTE — Progress Notes (Signed)
 " [facility]   Telephone:([phone]) [phone] Fax:([phone]) [phone]   Clinic New Consult Note   Patient Care Team: Orpha Yancey LABOR, MD as PCP - General (Internal Medicine) 11/12/2024  CHIEF COMPLAINTS/PURPOSE OF CONSULTATION:  Recurrent left lower extremity DVT  REFERRING PHYSICIAN: Dr. Gretta   Discussed the use of AI scribe software for clinical note transcription with the patient, who gave verbal consent to proceed.  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  In November 2025 he developed acute progressive left leg pain and swelling with throbbing discomfort and edema that limited mobility. He was started on apixaban  and has been adherent for two months. Symptoms have resolved. He uses compression stockings and remains physically active, working 7-8 hours daily, five days per week. He denies recent travel, trauma, or other provoking factors.  He had a prior DVT in the same leg in 2021 during prolonged immobility while homeless. He received 3-4 months of anticoagulation, agent unknown. He denies prior injury or surgery to that limb and has not had abnormal bleeding or bruising on anticoagulation.  He reports a strong maternal family history of breast cancer involving his mother and three maternal aunts, with metastatic disease in his mother. There is no known family history of thrombosis. He is interested in cancer genetic testing for himself to inform his daughter's risk.  He underwent neck surgery in January 2026 for an infected ingrown hair that was complicated by a borderline septic episode. He is performing local wound care. His only current pain is at the incision site.  He has diabetes and hypertension, both improved after weight loss from 500 lbs to 250 lbs over 1.5-2 years. He remains overweight and is pursuing further weight  loss. He denies current pain apart from the incision, and denies fever, chills, or diaphoresis.     MEDICAL HISTORY:  Past Medical History:  Diagnosis Date   Diabetes mellitus (HCC)    DVT (deep venous thrombosis) (HCC)    2021   HTN (hypertension)     SURGICAL HISTORY: History reviewed. No pertinent surgical history.  SOCIAL HISTORY: Social History   Socioeconomic History   Marital status: Married    Spouse name: Not on file   Number of children: 1   Years of education: Not on file   Highest education level: Not on file  Occupational History   Not on file  Tobacco Use   Smoking status: Former    Types: Cigarettes    Start date: 2006    Quit date: 1988    Years since quitting: 38.0   Smokeless tobacco: Never  Substance and Sexual Activity   Alcohol use: Not Currently    Comment: social drinker in the past   Drug use: Not Currently    Comment: used to smoke Marijuana   Sexual activity: Not Currently  Other Topics Concern   Not on file  Social History Narrative   ** Merged History Encounter **       Social Drivers of Health   Tobacco Use: Medium Risk (11/12/2024)   Patient History    Smoking Tobacco Use: Former    Smokeless Tobacco Use: Never    Passive Exposure: Not on file  Financial Resource Strain: Low Risk (11/04/2024)   Received from [facility]   Overall Financial Resource Strain (CARDIA)    How hard is it for you to pay for the  very basics like food, housing, medical care, and heating?: Not hard at all  Food Insecurity: No Food Insecurity (11/04/2024)   Received from [facility]   Epic    Within the past 12 months, you worried that your food would run out before you got the money to buy more.: Never true    Within the past 12 months, the food you bought just didn't last and you didn't have money to get more.: Never true  Transportation Needs: No Transportation Needs (11/04/2024)   Received from [facility] - Transportation     Lack of Transportation (Medical): No    Lack of Transportation (Non-Medical): No  Physical Activity: Insufficiently Active (11/04/2024)   Received from [facility]   Exercise Vital Sign    On average, how many days per week do you engage in moderate to strenuous exercise (like a brisk walk)?: 2 days    On average, how many minutes do you engage in exercise at this level?: 30 min  Stress: No Stress Concern Present (11/04/2024)   Received from [facility] - Occupational Stress Questionnaire    Do you feel stress - tense, restless, nervous, or anxious, or unable to sleep at night because your mind is troubled all the time - these days?: Only a little  Social Connections: Moderately Isolated (11/04/2024)   Received from [facility]   Social Connection and Isolation Panel    In a typical week, how many times do you talk on the phone with family, friends, or neighbors?: More than three times a week    How often do you get together with friends or relatives?: More than three times a week    How often do you attend church or religious services?: Never    Do you belong to any clubs or organizations such as church groups, unions, fraternal or athletic groups, or school groups?: No    How often do you attend meetings of the clubs or organizations you belong to?: Never    Are you married, widowed, divorced, separated, never married, or living with a partner?: Living with partner  Intimate Partner Violence: Not At Risk (11/04/2024)   Received from [facility]   Epic    Within the last year, have you been afraid of your partner or ex-partner?: No    Within the last year, have you been humiliated or emotionally abused in other ways by your partner or ex-partner?: No    Within the last year, have you been kicked, hit, slapped, or otherwise physically hurt by your partner or ex-partner?: No    Within the last year, have you been raped or forced to have  any kind of sexual activity by your partner or ex-partner?: No  Depression (PHQ2-9): Low Risk (11/12/2024)   Depression (PHQ2-9)    PHQ-2 Score: 0  Alcohol Screen: Not on file  Housing: Not on file  Utilities: Low Risk (11/04/2024)   Received from [facility]   Utilities    Within the past 12 months, have you been unable to get utilities(heat, electricity) when it was really needed?: No  Health Literacy: Low Risk (11/04/2024)   Received from [facility] Literacy    How often do you need to have someone help you when you read instructions, pamphlets, or other written material from your doctor or pharmacy?: Never    FAMILY HISTORY: Family History  Problem Relation Age of Onset   Cancer Mother        breast cancer   Cancer Maternal Aunt        breast cancer   Cancer Maternal Aunt        breast cancer   Cancer Maternal Aunt        breast cancer    ALLERGIES:  is allergic to shellfish allergy.  MEDICATIONS:  Current Outpatient Medications  Medication Sig Dispense Refill   ACCU-CHEK GUIDE TEST test strip 1 each daily.     amLODipine (NORVASC) 10 MG tablet Take 10 mg by mouth daily.     apixaban  (ELIQUIS ) 5 MG TABS tablet Take 1 tablet (5 mg total) by mouth 2 (two) times daily. Start taking after completion of starter pack. 180 tablet 0   Blood Glucose Monitoring Suppl (ACCU-CHEK GUIDE ME) w/Device KIT daily.     doxycycline (VIBRAMYCIN) 100 MG capsule Take 100 mg by mouth.     glipiZIDE (GLUCOTROL) 10 MG tablet Take 10 mg by mouth daily before breakfast.     hydrALAZINE (APRESOLINE) 25 MG tablet Take 25 mg by mouth.     oxyCODONE  (OXY IR/ROXICODONE ) 5 MG immediate release tablet Take 5 mg by mouth.     No current facility-administered medications for this visit.    REVIEW OF SYSTEMS:   Constitutional: Denies fevers, chills or abnormal night sweats Eyes: Denies blurriness of vision, double vision or watery eyes Ears, nose, mouth, throat, and face: Denies  mucositis or sore throat Respiratory: Denies cough, dyspnea or wheezes Cardiovascular: Denies palpitation, chest discomfort or lower extremity swelling Gastrointestinal:  Denies nausea, heartburn or change in bowel habits Skin: Denies abnormal skin rashes Lymphatics: Denies new lymphadenopathy or easy bruising Neurological:Denies numbness, tingling or new weaknesses Behavioral/Psych: Mood is stable, no new changes  All other systems were reviewed with the patient and are negative.  PHYSICAL EXAMINATION: ECOG PERFORMANCE STATUS: 1 - Symptomatic but completely ambulatory  Vitals:   11/12/24 1358 11/12/24 1400  BP: (!) 140/107 (!) 142/91  Pulse: (!) 111 (!) 106  Resp: 18   Temp: 97.8 F (36.6 C)   SpO2: 95%    Filed Weights   11/12/24 1358  Weight: 248 lb (112.5 kg)    GENERAL:alert, no distress and comfortable SKIN: skin color, texture, turgor are normal, no rashes or significant lesions EYES: normal, conjunctiva are pink and non-injected, sclera clear OROPHARYNX:no exudate, no erythema and lips, buccal mucosa, and tongue normal  NECK: supple, thyroid normal size, non-tender, without nodularity LYMPH:  no palpable lymphadenopathy in the cervical, axillary or inguinal LUNGS: clear to auscultation and percussion with normal breathing effort HEART: regular rate & rhythm and no murmurs and no lower extremity edema ABDOMEN:abdomen soft, non-tender and normal bowel sounds Musculoskeletal:no cyanosis of digits and no clubbing  PSYCH: alert & oriented x 3 with fluent speech NEURO: no focal motor/sensory deficits  Physical Exam   LABORATORY DATA:  I have reviewed the data as listed    Latest Ref Rng & Units 09/14/2024    4:08 PM  CBC  WBC 3.4 - 10.8 x10E3/uL 7.6   Hemoglobin 13.0 - 17.7 g/dL 85.6   Hematocrit 62.4 - 51.0 % 43.5   Platelets 150 - 450 x10E3/uL 222       Latest Ref Rng & Units 09/14/2024    4:08 PM  CMP  Glucose 70 - 99 mg/dL 592   BUN 6 - 24 mg/dL 11    Creatinine 9.23 - 1.27 mg/dL  0.94   Sodium 134 - 144 mmol/L 136   Potassium 3.5 - 5.2 mmol/L 4.4   Chloride 96 - 106 mmol/L 95   CO2 20 - 29 mmol/L 24   Calcium 8.7 - 10.2 mg/dL 9.4   Total Protein 6.0 - 8.5 g/dL 7.3   Total Bilirubin 0.0 - 1.2 mg/dL 0.4   Alkaline Phos 47 - 123 IU/L 99   AST 0 - 40 IU/L 18   ALT 0 - 44 IU/L 23      RADIOGRAPHIC STUDIES: I have personally reviewed the radiological images as listed and agreed with the findings in the report. No results found.  ASSESSMENT & PLAN:   Assessment and Plan Assessment & Plan Recurrent deep vein thrombosis of lower extremity He has had two episodes of DVT in the same lower extremity, the first likely provoked by immobility and the second unprovoked. He is relatively young for recurrent DVT, with no family history of thrombosis and no provoking factors for the most recent event. Symptoms resolved with anticoagulation, and he remains active. Indefinite anticoagulation is indicated due to the recurrent and unprovoked nature of the second event. - Recommended indefinite anticoagulation with Eliquis . - Discussed optional thrombophilia workup for family risk assessment; not required for current management. If pursued, Eliquis  should be discontinued for 2-4 weeks prior to testing. - Provided education on the necessity of continued Eliquis  unless bleeding or complications occur; advised to revisit management if such issues arise. - Discussed peri-procedural management: after three months of anticoagulation, short-term interruption for procedures is acceptable, with prompt resumption post-operatively, especially after orthopedic surgery. - No hematology follow-up required unless new issues develop. - Provided education on risk reduction: maintain activity, avoid prolonged immobility, and abstain from smoking (including cigarettes and marijuana).  Overweight He has achieved significant weight loss but remains overweight. His diabetes  and hypertension have improved with weight reduction, and he is actively pursuing further weight loss. Ongoing activity and healthy habits are important for overall health and DVT risk reduction. - Encouraged continued weight loss and maintenance of an active lifestyle. - Advised to avoid prolonged sitting and to take breaks during long periods of immobility to reduce DVT risk. - Reinforced importance of ongoing activity and healthy habits for overall health and DVT risk reduction.   Plan - Due to his recurrent left lower extremity DVT, especially unprovoked second episode, I recommend lifelong anticoagulation.  Patient agrees with the plan. - Hypercoagulopathy would not change his management, but can be considered if he wants to do it for his children in future. - Follow-up as needed.  No orders of the defined types were placed in this encounter.   All questions were answered. The patient knows to call the clinic with any problems, questions or concerns. I spent 25 minutes counseling the patient face to face. The total time spent in the appointment was 30 minutes including review of chart and various tests results, discussions about plan of care and coordination of care plan.     Onita Mattock, MD 11/12/2024 3:15 PM    "
